# Patient Record
Sex: Female | Born: 1995 | Race: Asian | Hispanic: No | Marital: Single | Smoking: Never smoker
Health system: Southern US, Community
[De-identification: ages and names within clinical notes are randomized; demographics above are authoritative.]

## PROBLEM LIST (undated history)

## (undated) DIAGNOSIS — J45909 Unspecified asthma, uncomplicated: Secondary | ICD-10-CM

---

## 2015-06-24 ENCOUNTER — Encounter (HOSPITAL_COMMUNITY): Payer: Self-pay | Admitting: Emergency Medicine

## 2015-06-24 DIAGNOSIS — S0990XA Unspecified injury of head, initial encounter: Secondary | ICD-10-CM | POA: Diagnosis not present

## 2015-06-24 DIAGNOSIS — W1789XA Other fall from one level to another, initial encounter: Secondary | ICD-10-CM | POA: Diagnosis not present

## 2015-06-24 DIAGNOSIS — Y929 Unspecified place or not applicable: Secondary | ICD-10-CM | POA: Diagnosis not present

## 2015-06-24 DIAGNOSIS — J45909 Unspecified asthma, uncomplicated: Secondary | ICD-10-CM | POA: Diagnosis not present

## 2015-06-24 DIAGNOSIS — Y999 Unspecified external cause status: Secondary | ICD-10-CM | POA: Diagnosis not present

## 2015-06-24 DIAGNOSIS — S161XXA Strain of muscle, fascia and tendon at neck level, initial encounter: Secondary | ICD-10-CM | POA: Insufficient documentation

## 2015-06-24 DIAGNOSIS — Y9342 Activity, yoga: Secondary | ICD-10-CM | POA: Diagnosis not present

## 2015-06-24 NOTE — ED Notes (Signed)
Pt. lost her balance while practicing yoga poses this evening , no LOC / ambulatory , reports headache and bilateral arms and hands " heaviness" . Equal grips with no arm drift.

## 2015-06-25 ENCOUNTER — Emergency Department (HOSPITAL_COMMUNITY): Payer: BLUE CROSS/BLUE SHIELD

## 2015-06-25 ENCOUNTER — Emergency Department (HOSPITAL_COMMUNITY)
Admission: EM | Admit: 2015-06-25 | Discharge: 2015-06-25 | Disposition: A | Discharge status: Home / Self Care | Payer: BLUE CROSS/BLUE SHIELD | Attending: Emergency Medicine | Admitting: Emergency Medicine

## 2015-06-25 DIAGNOSIS — S0990XA Unspecified injury of head, initial encounter: Secondary | ICD-10-CM

## 2015-06-25 DIAGNOSIS — S161XXA Strain of muscle, fascia and tendon at neck level, initial encounter: Secondary | ICD-10-CM

## 2015-06-25 HISTORY — DX: Unspecified asthma, uncomplicated: J45.909

## 2015-06-25 NOTE — Discharge Instructions (Signed)
Take ibuprofen as needed for pain. Refer to attached documents for more information.  °

## 2015-06-25 NOTE — ED Provider Notes (Signed)
CSN: [id number]     Arrival date & time 06/24/15  2342 History   First MD Initiated Contact with Patient 06/25/15 0126     Chief Complaint  Patient presents with  . Fall     (Consider location/radiation/quality/duration/timing/severity/associated sxs/prior Treatment) HPI Comments: Patient is an 19 year old female who presents to the ED with head and neck pain that started after falling while practicing yoga. Patient reports doing "crow" pose when she fell forward, landing on the top of her head with all her weight. She reports a sudden onset of a throbbing and severe headache at the point of impact and also aching and severe neck pain. No radiation of pain. No aggravating/alleviating factors. No LOC or other associated symptoms. She did not take anything for pain.    Past Medical History  Diagnosis Date  . Asthma    History reviewed. No pertinent past surgical history. No family history on file. Social History  Substance Use Topics  . Smoking status: Never Smoker   . Smokeless tobacco: None  . Alcohol Use: No   OB History    No data available     Review of Systems  Musculoskeletal: Positive for neck pain.  Neurological: Positive for headaches.  All other systems reviewed and are negative.     Allergies  Review of patient's allergies indicates no known allergies.  Home Medications   Prior to Admission medications   Not on File   BP 116/60 mmHg  Pulse 71  Temp(Src) 98.5 F (36.9 C) (Oral)  Resp 20  SpO2 99%  LMP 05/20/2015 (Approximate) Physical Exam  Constitutional: She is oriented to person, place, and time. She appears well-developed and well-nourished. No distress.  HENT:  Head: Normocephalic and atraumatic.  Eyes: Conjunctivae are normal.  Neck: Normal range of motion.  Cardiovascular: Normal rate and regular rhythm.  Exam reveals no gallop and no friction rub.   No murmur heard. Pulmonary/Chest: Effort normal and breath sounds normal. She has no  wheezes. She has no rales. She exhibits no tenderness.  Abdominal: Soft. She exhibits no distension. There is no tenderness. There is no rebound.  Musculoskeletal: Normal range of motion.  Mid line cervical spine tenderness to palpation.   Neurological: She is alert and oriented to person, place, and time. No cranial nerve deficit. Coordination normal.  Speech is goal-oriented. Moves limbs without ataxia. Extremity strength and sensation equal and intact bilaterally.   Skin: Skin is warm and dry.  Psychiatric: She has a normal mood and affect. Her behavior is normal.  Nursing note and vitals reviewed.   ED Course  Procedures (including critical care time) Labs Review Labs Reviewed - No data to display  Imaging Review Dg Cervical Spine Complete  06/25/2015  CLINICAL DATA:  19 year old female with history of injury from fall landing onto the top over head earlier this evening. Headache. EXAM: CERVICAL SPINE - COMPLETE 4+ VIEW COMPARISON:  No priors. FINDINGS: There is no evidence of cervical spine fracture or prevertebral soft tissue swelling. Alignment is normal. No other significant bone abnormalities are identified. IMPRESSION: Negative cervical spine radiographs. Electronically Signed   By: Trudie Reedaniel  Entrikin M.D.   On: 06/25/2015 02:04   Ct Head Wo Contrast  06/25/2015  CLINICAL DATA:  19 year old female with history of trauma from a fall onto her head complaining of headache and bilateral arm and hand heaviness. EXAM: CT HEAD WITHOUT CONTRAST TECHNIQUE: Contiguous axial images were obtained from the base of the skull through the vertex without  intravenous contrast. COMPARISON:  No priors. FINDINGS: No acute displaced skull fractures are identified. No acute intracranial abnormality. Specifically, no evidence of acute post-traumatic intracranial hemorrhage, no definite regions of acute/subacute cerebral ischemia, no focal mass, mass effect, hydrocephalus or abnormal intra or extra-axial fluid  collections. The visualized paranasal sinuses and mastoids are well pneumatized. IMPRESSION: 1. No acute displaced skull fractures or acute intracranial abnormalities. 2. The appearance of the brain is normal. Electronically Signed   By: Trudie Reed M.D.   On: 06/25/2015 02:17   I have personally reviewed and evaluated these images and lab results as part of my medical decision-making.   EKG Interpretation None      MDM   Final diagnoses:  Head injury, initial encounter  Cervical strain, acute, initial encounter    2:38 AM Patient's CT head and cervical spine plain film unremarkable for acute changes. Patient has no neuro deficits at this time. Vitals stable and patient afebrile. Patient instructed to take ibuprofen for pain.     [address] [place], PA-C 06/25/15 7829  Loren Racer, MD 06/25/15 [phone]

## 2015-11-27 ENCOUNTER — Ambulatory Visit (INDEPENDENT_AMBULATORY_CARE_PROVIDER_SITE_OTHER): Payer: 59 | Admitting: Sports Medicine

## 2015-11-27 ENCOUNTER — Encounter: Payer: Self-pay | Admitting: Sports Medicine

## 2015-11-27 VITALS — BP 134/87 | HR 78 | Ht 60.0 in | Wt 100.0 lb

## 2015-11-27 DIAGNOSIS — R202 Paresthesia of skin: Secondary | ICD-10-CM

## 2015-11-27 DIAGNOSIS — M79603 Pain in arm, unspecified: Secondary | ICD-10-CM | POA: Diagnosis not present

## 2015-11-27 DIAGNOSIS — M79602 Pain in left arm: Principal | ICD-10-CM

## 2015-11-27 DIAGNOSIS — M79601 Pain in right arm: Secondary | ICD-10-CM | POA: Insufficient documentation

## 2015-11-27 NOTE — Progress Notes (Signed)
  Paula Marsh - 20 y.o. female MRN [id number]  Date of birth: [birth year] Paula Marsh is a 20 y.o. female who presents today for bilateral arm paresthesias.  Bilateral paresthesias of the arm, initial visit 11/26/29-patient presents today for ongoing paresthesias of the arm with exercise since October 2016. At that point she fell and hit her head and before head with a little bit of a neck extension mechanism. A CT of her head and x-rays of the cervical spine were both negative for acute or chronic processes. Since that point she has started to have paresthesias of the arm starting on the right and progressing to the left that worsens with activity. This involves the entire arm and hand is not specific to a demonstratable dermatome. This is slowly progressing past several months to the point where she has stopped exercising this past week due to the pain and paresthesias. This has never happened prior to the injury. She denies any weakness in her upper extremity or neck pain at this time.  PMHx - Updated and reviewed.  Contributory factors include: Asthma PSHx - Updated and reviewed.  Contributory factors include:  Negative FHx - Updated and reviewed.  Contributory factors include:  Negative Social Hx - Updated and reviewed. Contributory factors include: Student  Medications - albuterol and Advair   ROS Per HPI.  12 point negative other than per HPI.   Exam:  Filed Vitals:   11/27/15 0856  BP: 134/87  Pulse: 78   Gen: NAD, AAO 3 Cardio- RRR Pulm - Normal respiratory effort/rate Skin: No rashes or erythema Extremities: No edema  Vascular: pulses +2 bilateral upper and lower extremity Psych: Normal affect  Neck: Negative spurling's Full neck range of motion Grip strength and sensation normal in bilateral hands Strength good C4 to T1 distribution No sensory change to C4 to T1 Reflexes normal Negative Adson test B/L   Imaging:  2 view C spine reviewed from  October 2016 which does not show disruption of the anterior/posterior vertebral line/spinolaminar line/posterior spinous line

## 2015-11-27 NOTE — Assessment & Plan Note (Signed)
On presentation with bilateral paresthesias of the upper extremity only with exercise. Describes all the way into her hand with no specific dermatome. -We'll obtain MRI of the C-spine to rule out anatomic abnormalities as previous C-spine x-ray in October was negative. -If her MRI of the neck is negative I would consider further workup for a thoracic outlet type syndrome versus Paula Marsh

## 2015-11-27 NOTE — Addendum Note (Signed)
Addended by: Linward HeadlandSTRAUGHN, Cainen Burnham N on: 11/27/2015 09:31 AM   Modules accepted: Orders

## 2015-11-29 ENCOUNTER — Ambulatory Visit
Admission: RE | Admit: 2015-11-29 | Discharge: 2015-11-29 | Disposition: A | Discharge status: Home / Self Care | Payer: PRIVATE HEALTH INSURANCE | Source: Ambulatory Visit | Attending: Sports Medicine | Admitting: Sports Medicine

## 2015-11-29 DIAGNOSIS — R202 Paresthesia of skin: Principal | ICD-10-CM

## 2015-11-29 DIAGNOSIS — M79602 Pain in left arm: Principal | ICD-10-CM

## 2015-11-29 DIAGNOSIS — M79601 Pain in right arm: Secondary | ICD-10-CM

## 2015-12-02 NOTE — Progress Notes (Signed)
Attempted to call pt in regards to her MRI results which basically showed a little drying of her discs at C spine levels but otherwise normal.  Please let her know when she calls back that this is what her MRI showed.  Thanks, Judie GrieveBryan

## 2016-06-04 ENCOUNTER — Encounter (HOSPITAL_COMMUNITY): Payer: Self-pay

## 2016-06-04 ENCOUNTER — Emergency Department (HOSPITAL_COMMUNITY)
Admission: EM | Admit: 2016-06-04 | Discharge: 2016-06-05 | Disposition: A | Discharge status: Home / Self Care | Payer: Commercial Managed Care - PPO | Attending: Emergency Medicine | Admitting: Emergency Medicine

## 2016-06-04 DIAGNOSIS — J705 Respiratory conditions due to smoke inhalation: Secondary | ICD-10-CM | POA: Diagnosis not present

## 2016-06-04 DIAGNOSIS — T59811A Toxic effect of smoke, accidental (unintentional), initial encounter: Secondary | ICD-10-CM

## 2016-06-04 DIAGNOSIS — R0602 Shortness of breath: Secondary | ICD-10-CM | POA: Diagnosis present

## 2016-06-04 DIAGNOSIS — Z79899 Other long term (current) drug therapy: Secondary | ICD-10-CM | POA: Insufficient documentation
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
## 2016-11-03 IMAGING — MR MR CERVICAL SPINE W/O CM
4 of 5 series · 28 of 48 positions shown · non-contrast
Comparison: Radiography 06/25/2015

CLINICAL DATA: Cervical neck pain radiating to both shoulders with
numbness in the arms. Yoga injury May 2015

EXAM:
MRI CERVICAL SPINE WITHOUT CONTRAST
TECHNIQUE: Multiplanar, multisequence MR imaging of the cervical spine was
performed. No intravenous contrast was administered.

[Series 3: T2 · sagittal · 3.0mm · 0.66mm/px · 6 of 12 slices shown (1 of 2)]
[im 1/12]
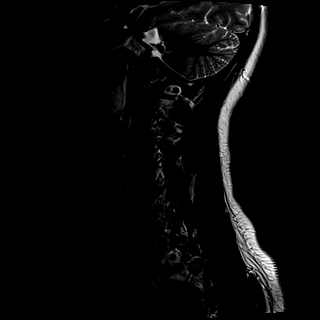
[im 3/12]
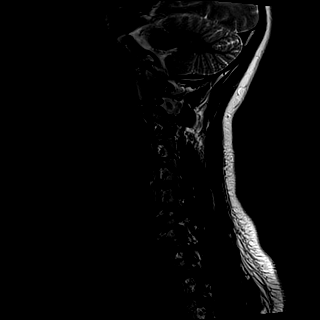
[im 5/12]
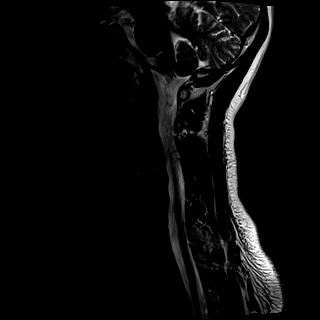
[im 7/12]
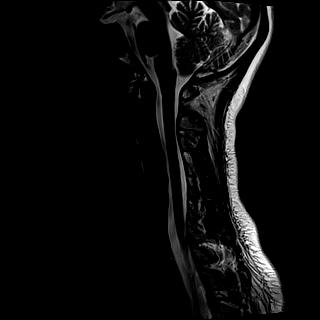
[im 9/12]
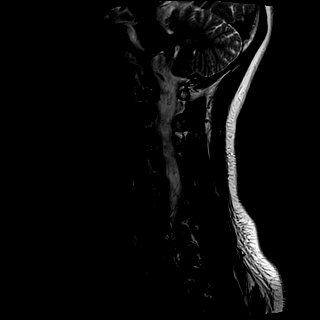
[im 12/12]
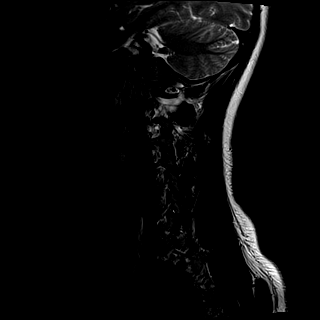

[Series 4: T1 · sagittal · 3.0mm · 0.41mm/px · 7 of 12 slices shown]
[im 1/12]
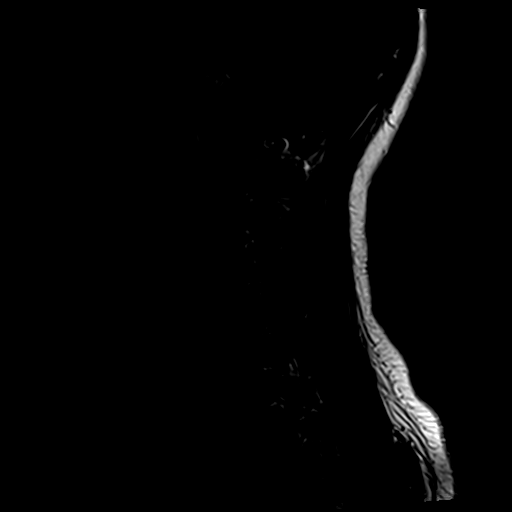
[im 2/12]
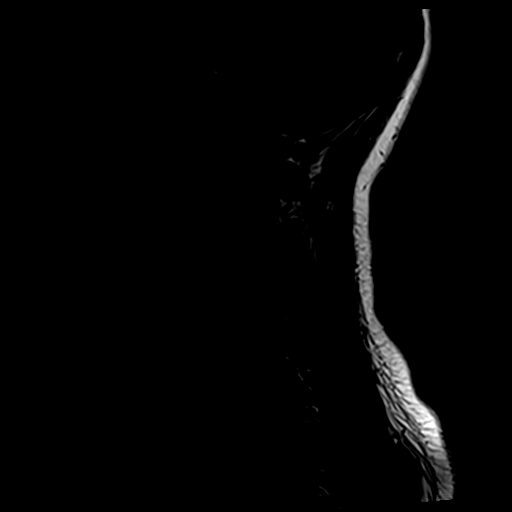
[im 4/12]
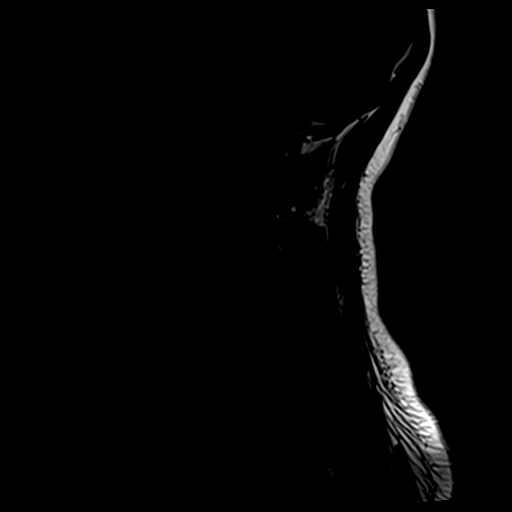
[im 6/12]
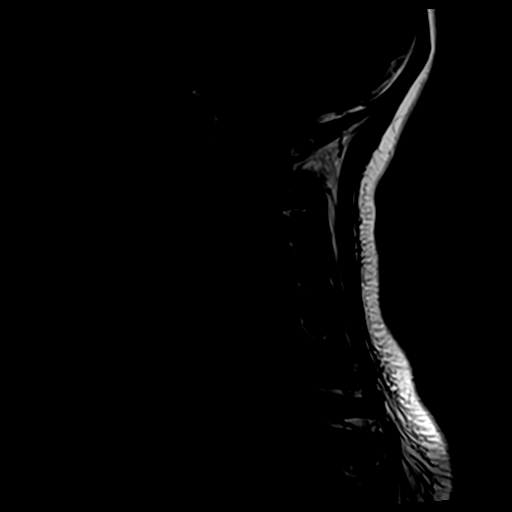
[im 8/12]
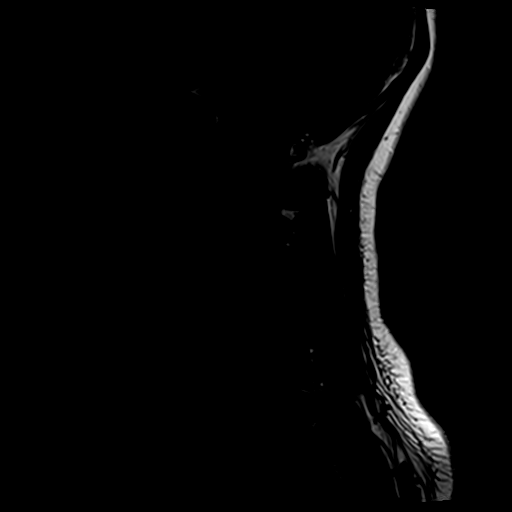
[im 10/12]
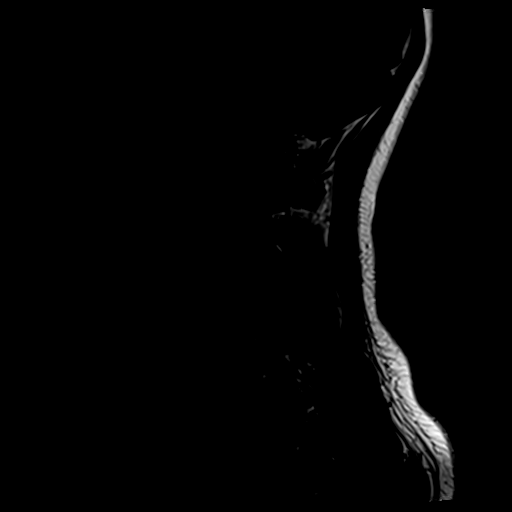
[im 12/12]
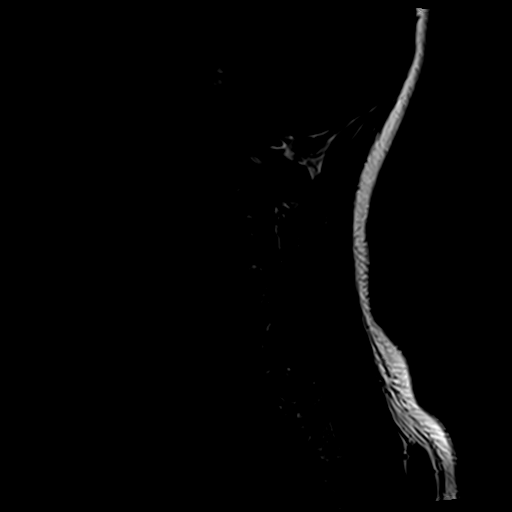

[Series 5: tir sag · sagittal · 3.0mm · 0.41mm/px · 7 of 12 slices shown]
[im 1/12]
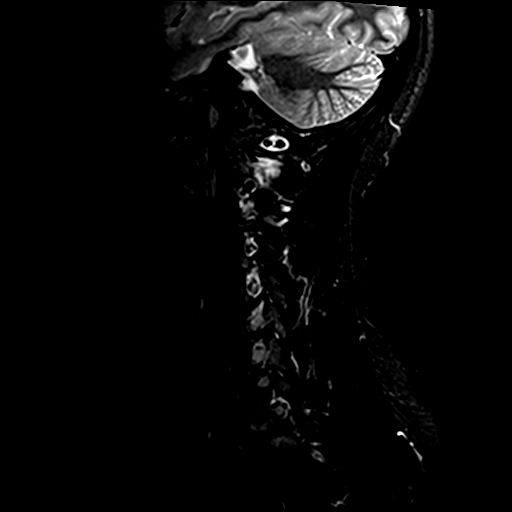
[im 2/12]
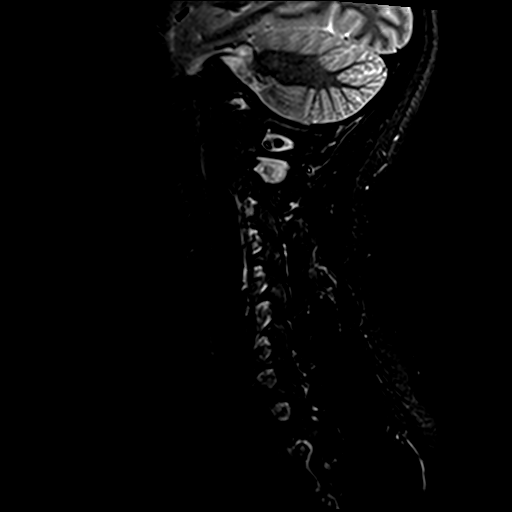
[im 4/12]
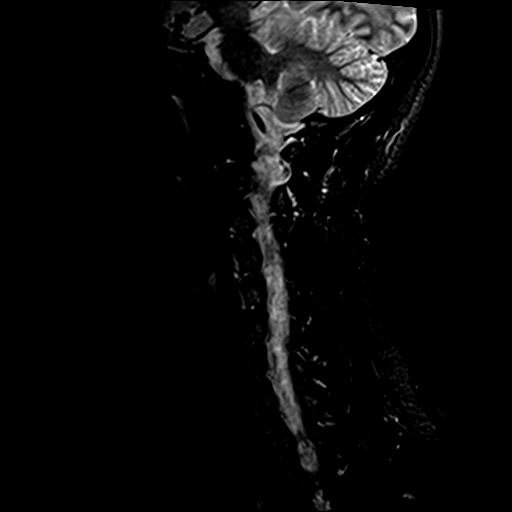
[im 6/12]
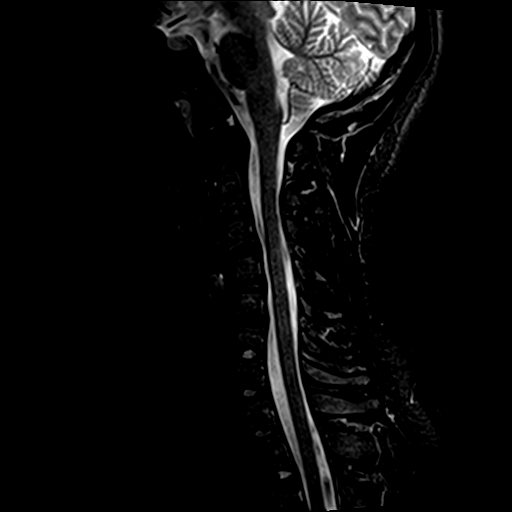
[im 8/12]
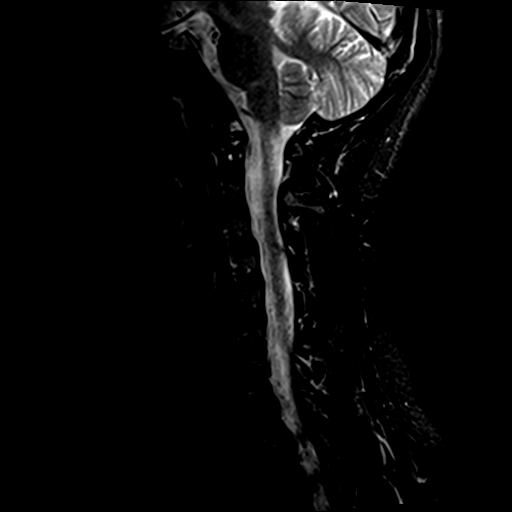
[im 10/12]
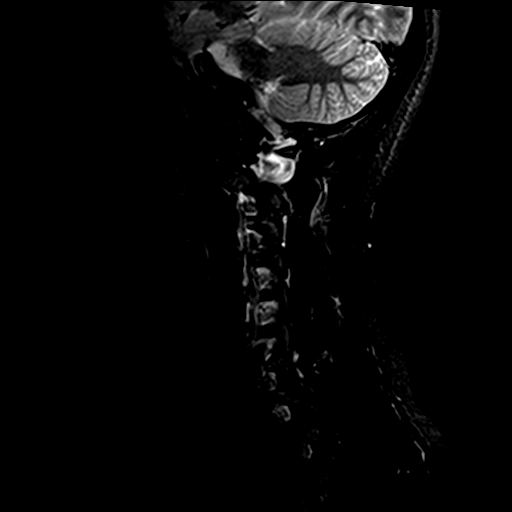
[im 12/12]
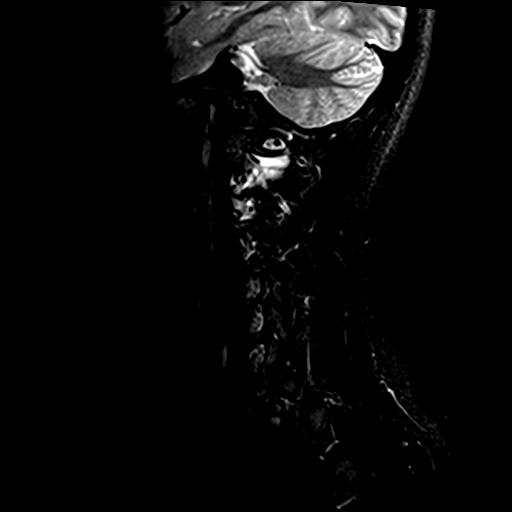

[Series 7: T2 · axial · 3.0mm · 0.70mm/px · z∈[-37,+47]mm · 8 of 24 slices shown (2 of 2)]
[im 1/24]
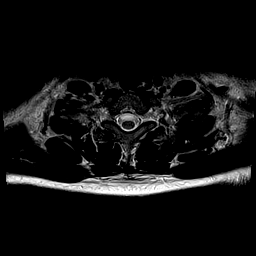
[im 4/24]
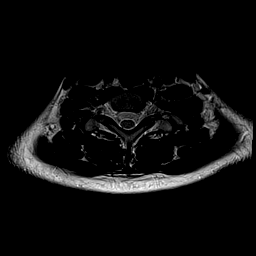
[im 8/24]
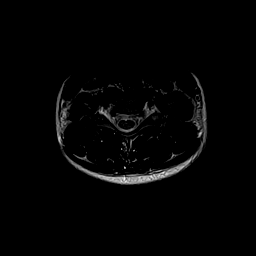
[im 11/24]
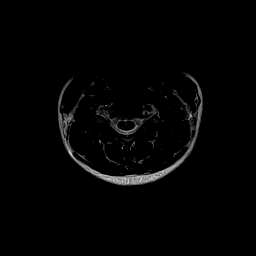
[im 13/24]
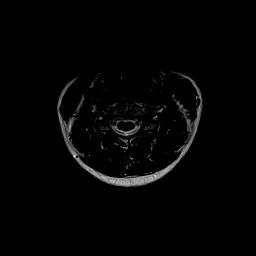
[im 16/24]
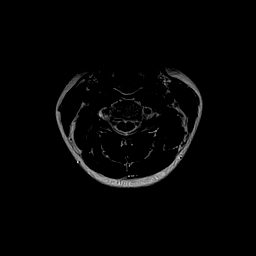
[im 20/24]
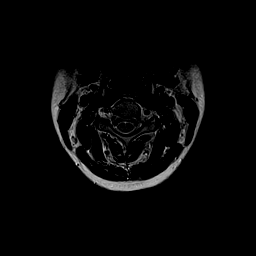
[im 24/24]
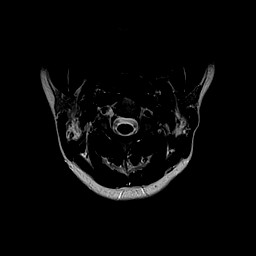

[28 of 48 positions shown; findings below may reference images not displayed]

FINDINGS: There is straightening of the normal cervical lordosis. There is no
abnormality at the foramen magnum, C1-2 or C2-3.

C3-4: Mild desiccation of the disc. No bulge or herniation. No
stenosis.

C4-5: Mild desiccation of the disc. No bulge or herniation. No
stenosis.

C5-6: Mild desiccation of the disc. No bulge or herniation. No
stenosis.

C6-7:  Normal interspace.

C7-T1:  Normal interspace.

No abnormal cord signal. Ample subarachnoid space surrounds the
cord.

Water sensitive imaging does not show any abnormal marrow or soft
tissue edema.
IMPRESSION: Straightening of the normal cervical lordosis. Mild desiccation of
the discs at C3-4, C4-5 and C5-6 but no bulge or herniation. No
stenosis or neural compression. No bone or soft tissue edema. No
specific post traumatic finding.
